# Patient Record
Sex: Male | Born: 1945 | Race: White | Hispanic: No | Marital: Married | Smoking: Former smoker
Health system: Southern US, Community
[De-identification: ages and names within clinical notes are randomized; demographics above are authoritative.]

## PROBLEM LIST (undated history)

## (undated) DIAGNOSIS — E785 Hyperlipidemia, unspecified: Secondary | ICD-10-CM

## (undated) DIAGNOSIS — I10 Essential (primary) hypertension: Secondary | ICD-10-CM

## (undated) DIAGNOSIS — N400 Enlarged prostate without lower urinary tract symptoms: Secondary | ICD-10-CM

## (undated) HISTORY — PX: APPENDECTOMY: SHX54

---

## 2015-06-18 ENCOUNTER — Encounter: Payer: Self-pay | Admitting: *Deleted

## 2015-06-21 ENCOUNTER — Ambulatory Visit: Payer: Medicare Other | Admitting: Anesthesiology

## 2015-06-21 ENCOUNTER — Encounter: Payer: Self-pay | Admitting: *Deleted

## 2015-06-21 ENCOUNTER — Ambulatory Visit
Admission: RE | Admit: 2015-06-21 | Discharge: 2015-06-21 | Disposition: A | Discharge status: Home / Self Care | Payer: Medicare Other | Source: Ambulatory Visit | Attending: Unknown Physician Specialty | Admitting: Unknown Physician Specialty

## 2015-06-21 ENCOUNTER — Encounter
Admission: RE | Disposition: A | Discharge status: Home / Self Care | Payer: Self-pay | Source: Ambulatory Visit | Attending: Unknown Physician Specialty

## 2015-06-21 DIAGNOSIS — I10 Essential (primary) hypertension: Secondary | ICD-10-CM | POA: Diagnosis not present

## 2015-06-21 DIAGNOSIS — E785 Hyperlipidemia, unspecified: Secondary | ICD-10-CM | POA: Diagnosis not present

## 2015-06-21 DIAGNOSIS — Z9049 Acquired absence of other specified parts of digestive tract: Secondary | ICD-10-CM | POA: Insufficient documentation

## 2015-06-21 DIAGNOSIS — Z79899 Other long term (current) drug therapy: Secondary | ICD-10-CM | POA: Diagnosis not present

## 2015-06-21 DIAGNOSIS — K5730 Diverticulosis of large intestine without perforation or abscess without bleeding: Secondary | ICD-10-CM | POA: Insufficient documentation

## 2015-06-21 DIAGNOSIS — K640 First degree hemorrhoids: Secondary | ICD-10-CM | POA: Diagnosis not present

## 2015-06-21 DIAGNOSIS — K621 Rectal polyp: Secondary | ICD-10-CM | POA: Insufficient documentation

## 2015-06-21 DIAGNOSIS — N400 Enlarged prostate without lower urinary tract symptoms: Secondary | ICD-10-CM | POA: Diagnosis not present

## 2015-06-21 DIAGNOSIS — Z87891 Personal history of nicotine dependence: Secondary | ICD-10-CM | POA: Insufficient documentation

## 2015-06-21 DIAGNOSIS — D123 Benign neoplasm of transverse colon: Secondary | ICD-10-CM | POA: Diagnosis not present

## 2015-06-21 DIAGNOSIS — Z1211 Encounter for screening for malignant neoplasm of colon: Secondary | ICD-10-CM | POA: Insufficient documentation

## 2015-06-21 HISTORY — DX: Essential (primary) hypertension: I10

## 2015-06-21 HISTORY — DX: Hyperlipidemia, unspecified: E78.5

## 2015-06-21 HISTORY — DX: Benign prostatic hyperplasia without lower urinary tract symptoms: N40.0

## 2015-06-21 HISTORY — PX: COLONOSCOPY WITH PROPOFOL: SHX5780

## 2015-06-21 SURGERY — COLONOSCOPY WITH PROPOFOL
Anesthesia: General

## 2015-06-21 MED ORDER — SODIUM CHLORIDE 0.9 % IV SOLN
INTRAVENOUS | Status: DC
  Administered 2015-06-21 (×2): via INTRAVENOUS

## 2015-06-21 MED ORDER — SODIUM CHLORIDE 0.9 % IV SOLN: INTRAVENOUS | Status: DC

## 2015-06-21 MED ORDER — LIDOCAINE HCL (CARDIAC) 20 MG/ML IV SOLN
INTRAVENOUS | Status: DC | PRN
  Administered 2015-06-21: 30 mg via INTRAVENOUS

## 2015-06-21 MED ORDER — PROPOFOL 10 MG/ML IV BOLUS
INTRAVENOUS | Status: DC | PRN
  Administered 2015-06-21: 100 mg via INTRAVENOUS

## 2015-06-21 MED ORDER — PROPOFOL 500 MG/50ML IV EMUL
INTRAVENOUS | Status: DC | PRN
  Administered 2015-06-21: 170 ug/kg/min via INTRAVENOUS

## 2015-06-21 MED ORDER — FENTANYL CITRATE (PF) 100 MCG/2ML IJ SOLN
INTRAMUSCULAR | Status: DC | PRN
  Administered 2015-06-21: 50 ug via INTRAVENOUS

## 2015-06-21 MED ORDER — MIDAZOLAM HCL 5 MG/5ML IJ SOLN
INTRAMUSCULAR | Status: DC | PRN
  Administered 2015-06-21: 1 mg via INTRAVENOUS

## 2015-06-21 NOTE — Transfer of Care (Signed)
Immediate Anesthesia Transfer of Care Note  Patient: Hayden Gray  Procedure(s) Performed: Procedure(s): COLONOSCOPY WITH PROPOFOL (N/A)  Patient Location: PACU and Short Stay  Anesthesia Type:General  Level of Consciousness: oriented and patient cooperative  Airway & Oxygen Therapy: Patient Spontanous Breathing and Patient connected to nasal cannula oxygen  Post-op Assessment: Report given to RN and Post -op Vital signs reviewed and stable  Post vital signs: Reviewed and stable  Last Vitals:  Filed Vitals:   06/21/15 1250  BP: 138/80  Pulse: 81  Temp: 35.8 C  Resp: 16    Complications: No apparent anesthesia complications

## 2015-06-21 NOTE — H&P (Signed)
   Primary Care Physician:  Rusty Aus., MD Primary Gastroenterologist:  Dr. Vira Agar  Pre-Procedure History & Physical: HPI:  Hayden Gray is a 69 y.o. male is here for an colonoscopy.   Past Medical History  Diagnosis Date  . Hypertension   . BPH (benign prostatic hypertrophy)   . Hyperlipidemia     Past Surgical History  Procedure Laterality Date  . Appendectomy      Prior to Admission medications   Medication Sig Start Date End Date Taking? Authorizing Provider  atorvastatin (LIPITOR) 20 MG tablet Take 20 mg by mouth daily.   Yes Historical Provider, MD  finasteride (PROSCAR) 5 MG tablet Take 5 mg by mouth daily.   Yes Historical Provider, MD  fosinopril (MONOPRIL) 20 MG tablet Take 20 mg by mouth daily.   Yes Historical Provider, MD  tamsulosin (FLOMAX) 0.4 MG CAPS capsule Take 0.4 mg by mouth daily after breakfast.   Yes Historical Provider, MD    Allergies as of 05/25/2015  . (Not on File)    History reviewed. No pertinent family history.  Social History   Social History  . Marital Status: Married    Spouse Name: N/A  . Number of Children: N/A  . Years of Education: N/A   Occupational History  . Not on file.   Social History Main Topics  . Smoking status: Former Smoker    Types: Cigarettes    Quit date: 06/20/1972  . Smokeless tobacco: Not on file  . Alcohol Use: 0.6 oz/week    1 Cans of beer per week  . Drug Use: No  . Sexual Activity: Not on file   Other Topics Concern  . Not on file   Social History Narrative    Review of Systems: See HPI, otherwise negative ROS  Physical Exam: BP 138/80 mmHg  Pulse 81  Temp(Src) 96.5 F (35.8 C) (Tympanic)  Resp 16  Ht 5\' 8"  (1.727 m)  Wt 73.936 kg (163 lb)  BMI 24.79 kg/m2  SpO2 98% General:   Alert,  pleasant and cooperative in NAD Head:  Normocephalic and atraumatic. Neck:  Supple; no masses or thyromegaly. Lungs:  Clear throughout to auscultation.    Heart:  Regular rate and  rhythm. Abdomen:  Soft, nontender and nondistended. Normal bowel sounds, without guarding, and without rebound.   Neurologic:  Alert and  oriented x4;  grossly normal neurologically.  Impression/Plan: Hayden Gray is here for an colonoscopy to be performed for screening colonoscopy  Risks, benefits, limitations, and alternatives regarding  colonoscopy have been reviewed with the patient.  Questions have been answered.  All parties agreeable.   Gaylyn Cheers, MD  06/21/2015, 1:19 PM

## 2015-06-21 NOTE — Op Note (Signed)
[facility] Gastroenterology Patient Name: Hayden Gray Procedure Date: 06/21/2015 1:05 PM MRN: [id number] Account #: [id number] Date of Birth: Jul 23, 1946 Admit Type: Outpatient Age: 69 Room: [facility] ENDO ROOM 1 Gender: Male Note Status: Finalized Procedure:         Colonoscopy Indications:       Screening for colorectal malignant neoplasm Providers:         Manya Silvas, MD Referring MD:      Rusty Aus, MD (Referring MD) Medicines:         Propofol per Anesthesia Complications:     No immediate complications. Procedure:         Pre-Anesthesia Assessment:                    - After reviewing the risks and benefits, the patient was                     deemed in satisfactory condition to undergo the procedure.                    After obtaining informed consent, the colonoscope was                     passed under direct vision. Throughout the procedure, the                     patient's blood pressure, pulse, and oxygen saturations                     were monitored continuously. The Colonoscope was                     introduced through the anus and advanced to the the cecum,                     identified by appendiceal orifice and ileocecal valve. The                     colonoscopy was performed without difficulty. The patient                     tolerated the procedure well. The quality of the bowel                     preparation was good. Findings:      A diminutive polyp was found in the rectum. The polyp was sessile. The       polyp was removed with a jumbo cold forceps. Resection and retrieval       were complete.      A diminutive polyp was found at the splenic flexure. The polyp was       sessile. The polyp was removed with a jumbo cold forceps. Resection and       retrieval were complete.      Multiple small and large-mouthed diverticula were found in the sigmoid       colon.      Internal hemorrhoids were found during endoscopy. The  hemorrhoids were       small and Grade I (internal hemorrhoids that do not prolapse).      Two very small angioectasias without bleeding were found in the cecum. Impression:        - One diminutive polyp in the rectum. Resected and  retrieved.                    - One diminutive polyp at the splenic flexure. Resected                     and retrieved.                    - Diverticulosis in the sigmoid colon.                    - Internal hemorrhoids. Recommendation:    - Await pathology results. Manya Silvas, MD 06/21/2015 1:46:25 PM This report has been signed electronically. Number of Addenda: 0 Note Initiated On: 06/21/2015 1:05 PM Scope Withdrawal Time: 0 hours 10 minutes 2 seconds  Total Procedure Duration: 0 hours 15 minutes 0 seconds       [facility]

## 2015-06-21 NOTE — Anesthesia Preprocedure Evaluation (Signed)
Anesthesia Evaluation  Patient identified by MRN, date of birth, ID band Patient awake    Reviewed: Allergy & Precautions, H&P , NPO status , Patient's Chart, lab work & pertinent test results, reviewed documented beta blocker date and time   History of Anesthesia Complications Negative for: history of anesthetic complications  Airway Mallampati: III  TM Distance: >3 FB Neck ROM: full    Dental no notable dental hx. (+) Teeth Intact Permanent bridge on the bottom:   Pulmonary neg pulmonary ROS, former smoker,    Pulmonary exam normal breath sounds clear to auscultation       Cardiovascular Exercise Tolerance: Good hypertension, On Medications (-) angina(-) CAD, (-) Past MI, (-) Cardiac Stents and (-) CABG Normal cardiovascular exam(-) dysrhythmias (-) Valvular Problems/Murmurs Rhythm:regular Rate:Normal     Neuro/Psych negative neurological ROS  negative psych ROS   GI/Hepatic negative GI ROS, Neg liver ROS,   Endo/Other  negative endocrine ROS  Renal/GU negative Renal ROS  negative genitourinary   Musculoskeletal   Abdominal   Peds  Hematology negative hematology ROS (+)   Anesthesia Other Findings Past Medical History:   Hypertension                                                 BPH (benign prostatic hypertrophy)                           Hyperlipidemia                                               Reproductive/Obstetrics negative OB ROS                             Anesthesia Physical Anesthesia Plan  ASA: II  Anesthesia Plan: General   Post-op Pain Management:    Induction:   Airway Management Planned:   Additional Equipment:   Intra-op Plan:   Post-operative Plan:   Informed Consent: I have reviewed the patients History and Physical, chart, labs and discussed the procedure including the risks, benefits and alternatives for the proposed anesthesia with the patient or  authorized representative who has indicated his/her understanding and acceptance.   Dental Advisory Given  Plan Discussed with: Anesthesiologist, CRNA and Surgeon  Anesthesia Plan Comments:         Anesthesia Quick Evaluation

## 2015-06-22 ENCOUNTER — Encounter: Payer: Self-pay | Admitting: Unknown Physician Specialty

## 2015-06-22 NOTE — Anesthesia Postprocedure Evaluation (Signed)
  Anesthesia Post-op Note  Patient: Hayden Gray  Procedure(s) Performed: Procedure(s): COLONOSCOPY WITH PROPOFOL (N/A)  Anesthesia type:General  Patient location: PACU  Post pain: Pain level controlled  Post assessment: Post-op Vital signs reviewed, Patient's Cardiovascular Status Stable, Respiratory Function Stable, Patent Airway and No signs of Nausea or vomiting  Post vital signs: Reviewed and stable  Last Vitals:  Filed Vitals:   06/21/15 1430  BP: 132/76  Pulse: 59  Temp:   Resp: 16    Level of consciousness: awake, alert  and patient cooperative  Complications: No apparent anesthesia complications

## 2015-06-23 LAB — SURGICAL PATHOLOGY

## 2018-01-09 ENCOUNTER — Other Ambulatory Visit: Payer: Self-pay | Admitting: Internal Medicine

## 2018-01-09 DIAGNOSIS — R911 Solitary pulmonary nodule: Secondary | ICD-10-CM

## 2018-01-16 ENCOUNTER — Ambulatory Visit: Payer: Medicare Other

## 2018-01-22 ENCOUNTER — Ambulatory Visit
Admission: RE | Admit: 2018-01-22 | Discharge: 2018-01-22 | Disposition: A | Discharge status: Home / Self Care | Payer: Medicare Other | Source: Ambulatory Visit | Attending: Internal Medicine | Admitting: Internal Medicine

## 2018-01-22 DIAGNOSIS — R918 Other nonspecific abnormal finding of lung field: Secondary | ICD-10-CM | POA: Insufficient documentation

## 2018-01-22 DIAGNOSIS — N200 Calculus of kidney: Secondary | ICD-10-CM | POA: Insufficient documentation

## 2018-01-22 DIAGNOSIS — R911 Solitary pulmonary nodule: Secondary | ICD-10-CM

## 2018-01-22 DIAGNOSIS — E042 Nontoxic multinodular goiter: Secondary | ICD-10-CM | POA: Diagnosis not present

## 2018-01-22 LAB — POCT I-STAT CREATININE: Creatinine, Ser: 1 mg/dL (ref 0.61–1.24)

## 2018-01-22 MED ORDER — IOPAMIDOL (ISOVUE-300) INJECTION 61%
75.0000 mL | Freq: Once | INTRAVENOUS | Status: AC | PRN
  Administered 2018-01-22: 75 mL via INTRAVENOUS

## 2018-01-29 ENCOUNTER — Other Ambulatory Visit: Payer: Self-pay | Admitting: Internal Medicine

## 2018-01-29 DIAGNOSIS — N2889 Other specified disorders of kidney and ureter: Secondary | ICD-10-CM

## 2018-02-21 ENCOUNTER — Emergency Department: Payer: Medicare Other

## 2018-02-21 ENCOUNTER — Other Ambulatory Visit: Payer: Self-pay

## 2018-02-21 ENCOUNTER — Ambulatory Visit
Admission: RE | Admit: 2018-02-21 | Discharge: 2018-02-21 | Disposition: A | Discharge status: Home / Self Care | Payer: Medicare Other | Source: Ambulatory Visit | Attending: Internal Medicine | Admitting: Internal Medicine

## 2018-02-21 ENCOUNTER — Inpatient Hospital Stay
Admission: EM | Admit: 2018-02-21 | Discharge: 2018-02-22 | DRG: 916 | Disposition: A | Discharge status: Home / Self Care | Payer: Medicare Other | Attending: Internal Medicine | Admitting: Internal Medicine

## 2018-02-21 DIAGNOSIS — I10 Essential (primary) hypertension: Secondary | ICD-10-CM | POA: Diagnosis not present

## 2018-02-21 DIAGNOSIS — T782XXA Anaphylactic shock, unspecified, initial encounter: Secondary | ICD-10-CM | POA: Diagnosis present

## 2018-02-21 DIAGNOSIS — T886XXA Anaphylactic reaction due to adverse effect of correct drug or medicament properly administered, initial encounter: Principal | ICD-10-CM | POA: Diagnosis present

## 2018-02-21 DIAGNOSIS — E785 Hyperlipidemia, unspecified: Secondary | ICD-10-CM | POA: Diagnosis not present

## 2018-02-21 DIAGNOSIS — Z87891 Personal history of nicotine dependence: Secondary | ICD-10-CM

## 2018-02-21 DIAGNOSIS — T508X5A Adverse effect of diagnostic agents, initial encounter: Secondary | ICD-10-CM | POA: Diagnosis present

## 2018-02-21 DIAGNOSIS — N400 Enlarged prostate without lower urinary tract symptoms: Secondary | ICD-10-CM | POA: Diagnosis not present

## 2018-02-21 DIAGNOSIS — N2889 Other specified disorders of kidney and ureter: Secondary | ICD-10-CM | POA: Diagnosis not present

## 2018-02-21 DIAGNOSIS — Y848 Other medical procedures as the cause of abnormal reaction of the patient, or of later complication, without mention of misadventure at the time of the procedure: Secondary | ICD-10-CM | POA: Diagnosis not present

## 2018-02-21 DIAGNOSIS — Z91041 Radiographic dye allergy status: Secondary | ICD-10-CM | POA: Diagnosis not present

## 2018-02-21 DIAGNOSIS — Z79899 Other long term (current) drug therapy: Secondary | ICD-10-CM | POA: Diagnosis not present

## 2018-02-21 LAB — COMPREHENSIVE METABOLIC PANEL
ALBUMIN: 4.3 g/dL (ref 3.5–5.0)
ALK PHOS: 84 U/L (ref 38–126)
ALT: 20 U/L (ref 0–44)
ANION GAP: 10 (ref 5–15)
AST: 26 U/L (ref 15–41)
BUN: 16 mg/dL (ref 8–23)
CO2: 22 mmol/L (ref 22–32)
Calcium: 8.8 mg/dL — ABNORMAL LOW (ref 8.9–10.3)
Chloride: 110 mmol/L (ref 98–111)
Creatinine, Ser: 0.92 mg/dL (ref 0.61–1.24)
GFR calc non Af Amer: >60 mL/min (ref >60)
GLUCOSE: 133 mg/dL — AB (ref 70–99)
POTASSIUM: 3.1 mmol/L — AB (ref 3.5–5.1)
SODIUM: 142 mmol/L (ref 135–145)
Total Bilirubin: 2.6 mg/dL — ABNORMAL HIGH (ref 0.3–1.2)
Total Protein: 7.2 g/dL (ref 6.5–8.1)

## 2018-02-21 LAB — CBC WITH DIFFERENTIAL/PLATELET
BASOS ABS: 0.1 10*3/uL (ref 0–0.1)
BASOS PCT: 1 %
EOS ABS: 0.1 10*3/uL (ref 0–0.7)
Eosinophils Relative: 1 %
HEMATOCRIT: 41.1 % (ref 40.0–52.0)
HEMOGLOBIN: 14.4 g/dL (ref 13.0–18.0)
Lymphocytes Relative: 44 %
Lymphs Abs: 4.4 10*3/uL — ABNORMAL HIGH (ref 1.0–3.6)
MCH: 32.6 pg (ref 26.0–34.0)
MCHC: 35.1 g/dL (ref 32.0–36.0)
MCV: 92.7 fL (ref 80.0–100.0)
Monocytes Absolute: 0.9 10*3/uL (ref 0.2–1.0)
Monocytes Relative: 9 %
NEUTROS ABS: 4.5 10*3/uL (ref 1.4–6.5)
NEUTROS PCT: 45 %
Platelets: 218 10*3/uL (ref 150–440)
RBC: 4.44 MIL/uL (ref 4.40–5.90)
RDW: 14.2 % (ref 11.5–14.5)
WBC: 10 10*3/uL (ref 3.8–10.6)

## 2018-02-21 LAB — MRSA PCR SCREENING: MRSA by PCR: NEGATIVE

## 2018-02-21 LAB — TROPONIN I

## 2018-02-21 LAB — GLUCOSE, CAPILLARY: Glucose-Capillary: 207 mg/dL — ABNORMAL HIGH (ref 70–99)

## 2018-02-21 MED ORDER — IBUPROFEN 400 MG PO TABS
400.0000 mg | ORAL_TABLET | Freq: Four times a day (QID) | ORAL | Status: DC | PRN
Start: 2018-02-21 — End: 2018-02-22

## 2018-02-21 MED ORDER — GADOBENATE DIMEGLUMINE 529 MG/ML IV SOLN
15.0000 mL | Freq: Once | INTRAVENOUS | Status: AC | PRN
  Administered 2018-02-21: 15 mL via INTRAVENOUS

## 2018-02-21 MED ORDER — TAMSULOSIN HCL 0.4 MG PO CAPS: 0.4000 mg | ORAL_CAPSULE | Freq: Every day | ORAL | Status: DC

## 2018-02-21 MED ORDER — METHYLPREDNISOLONE SODIUM SUCC 125 MG IJ SOLR
60.0000 mg | Freq: Four times a day (QID) | INTRAMUSCULAR | Status: DC
  Administered 2018-02-21 – 2018-02-22 (×3): 60 mg via INTRAVENOUS
  Filled 2018-02-21 (×3): qty 2

## 2018-02-21 MED ORDER — DIPHENHYDRAMINE HCL 50 MG/ML IJ SOLN: 25.0000 mg | Freq: Four times a day (QID) | INTRAMUSCULAR | Status: DC | PRN

## 2018-02-21 MED ORDER — EPINEPHRINE PF 1 MG/ML IJ SOLN
0.5000 ug/min | INTRAMUSCULAR | Status: DC
  Administered 2018-02-21: 0.5 ug/min via INTRAVENOUS
  Filled 2018-02-21: qty 4

## 2018-02-21 MED ORDER — ENOXAPARIN SODIUM 40 MG/0.4ML ~~LOC~~ SOLN
40.0000 mg | SUBCUTANEOUS | Status: DC
  Administered 2018-02-21: 40 mg via SUBCUTANEOUS
  Filled 2018-02-21: qty 0.4

## 2018-02-21 MED ORDER — ALBUTEROL SULFATE (2.5 MG/3ML) 0.083% IN NEBU: 2.5000 mg | INHALATION_SOLUTION | RESPIRATORY_TRACT | Status: DC | PRN

## 2018-02-21 MED ORDER — SODIUM CHLORIDE 0.9% FLUSH
3.0000 mL | Freq: Two times a day (BID) | INTRAVENOUS | Status: DC
  Administered 2018-02-21 – 2018-02-22 (×2): 3 mL via INTRAVENOUS

## 2018-02-21 MED ORDER — POLYETHYLENE GLYCOL 3350 17 G PO PACK: 17.0000 g | PACK | Freq: Every day | ORAL | Status: DC | PRN

## 2018-02-21 MED ORDER — EPINEPHRINE 0.3 MG/0.3ML IJ SOAJ
0.3000 mg | Freq: Once | INTRAMUSCULAR | Status: AC
Start: 2018-02-21 — End: 2018-02-21
  Administered 2018-02-21: 0.3 mg via INTRAMUSCULAR

## 2018-02-21 MED ORDER — LISINOPRIL 20 MG PO TABS
20.0000 mg | ORAL_TABLET | Freq: Every day | ORAL | Status: DC
Start: 2018-02-21 — End: 2018-02-22
  Administered 2018-02-21: 20 mg via ORAL
  Filled 2018-02-21 (×2): qty 1

## 2018-02-21 MED ORDER — ONDANSETRON HCL 4 MG/2ML IJ SOLN: 4.0000 mg | Freq: Four times a day (QID) | INTRAMUSCULAR | Status: DC | PRN

## 2018-02-21 MED ORDER — EPINEPHRINE 0.3 MG/0.3ML IJ SOAJ
0.3000 mg | Freq: Once | INTRAMUSCULAR | Status: AC
  Administered 2018-02-21: 0.3 mg via INTRAMUSCULAR

## 2018-02-21 MED ORDER — METHYLPREDNISOLONE SODIUM SUCC 125 MG IJ SOLR
125.0000 mg | Freq: Once | INTRAMUSCULAR | Status: AC
  Administered 2018-02-21: 125 mg via INTRAVENOUS
  Filled 2018-02-21: qty 2

## 2018-02-21 MED ORDER — ACETAMINOPHEN 325 MG PO TABS
650.0000 mg | ORAL_TABLET | Freq: Four times a day (QID) | ORAL | Status: DC | PRN
Start: 2018-02-21 — End: 2018-02-22
  Administered 2018-02-22: 650 mg via ORAL
  Filled 2018-02-21: qty 2

## 2018-02-21 MED ORDER — POTASSIUM CHLORIDE CRYS ER 20 MEQ PO TBCR
30.0000 meq | EXTENDED_RELEASE_TABLET | ORAL | Status: AC
  Administered 2018-02-21 – 2018-02-22 (×2): 30 meq via ORAL
  Filled 2018-02-21 (×2): qty 2

## 2018-02-21 MED ORDER — FINASTERIDE 5 MG PO TABS
5.0000 mg | ORAL_TABLET | Freq: Every day | ORAL | Status: DC
  Administered 2018-02-21: 5 mg via ORAL
  Filled 2018-02-21 (×2): qty 1

## 2018-02-21 MED ORDER — ONDANSETRON HCL 4 MG PO TABS: 4.0000 mg | ORAL_TABLET | Freq: Four times a day (QID) | ORAL | Status: DC | PRN

## 2018-02-21 MED ORDER — FAMOTIDINE IN NACL 20-0.9 MG/50ML-% IV SOLN
20.0000 mg | Freq: Once | INTRAVENOUS | Status: AC
  Administered 2018-02-21: 20 mg via INTRAVENOUS

## 2018-02-21 MED ORDER — ACETAMINOPHEN 650 MG RE SUPP: 650.0000 mg | Freq: Four times a day (QID) | RECTAL | Status: DC | PRN

## 2018-02-21 MED ORDER — SODIUM CHLORIDE 0.9 % IV BOLUS
1000.0000 mL | Freq: Once | INTRAVENOUS | Status: AC
Start: 2018-02-21 — End: 2018-02-21
  Administered 2018-02-21: 1000 mL via INTRAVENOUS

## 2018-02-21 MED ORDER — TAMSULOSIN HCL 0.4 MG PO CAPS
0.4000 mg | ORAL_CAPSULE | Freq: Every day | ORAL | Status: DC
  Administered 2018-02-21: 0.4 mg via ORAL
  Filled 2018-02-21: qty 1

## 2018-02-21 NOTE — ED Notes (Signed)
Called back and spoke with Evlyn Clines RN in ICU. She stated they were not able to take patient at this time and would call back shortly.

## 2018-02-21 NOTE — ED Notes (Signed)
Attempted to call report. Per Kendrick Fries they are not ready and will call when able to take patient. Will Medical illustrator

## 2018-02-21 NOTE — ED Notes (Signed)
Pt given 1st epi pen injection by MD Rifenbark

## 2018-02-21 NOTE — ED Notes (Signed)
Attempted to call report. Per Kendrick Fries they are not ready and unable to take patient at this time

## 2018-02-21 NOTE — Consult Note (Signed)
^^^For educational purposes only^^^^  Name: Hayden Gray MRN: [id number] DOB: 27-Dec-1945    ADMISSION DATE:  02/21/2018 CONSULTATION DATE:  02/21/2018  REFERRING MD :    CHIEF COMPLAINT:  anaphylactic reaction to gadolinium   BRIEF PATIENT DESCRIPTION: 72 y/o caucasian male admitted for anaphylactic reaction to MRI contrast gadolinium   SIGNIFICANT EVENTS/STUDIES:  7/11-MRI of abdomen with and without contrast performed and patient experienced angioedema, hives and hoarseness    HISTORY OF PRESENT ILLNESS:  Mr. Hollyfield is a 72 year old, Caucasian male with a PMH significant for HTN, BPH and dyslipidemia whom was undergoing a MRI of the abdomen (with and without contrast) for evaluation of a renal mass when he suddenly developed lip numbness, hoarseness of the voice and diaphoresis likely secondary to an anaphylactic reaction to the MRI contrast gadolinium. While in MRI the patient was given 50mg  of IV Benadryl without any resolve of symptoms and was sent to the ED for further treatment. While in the ED the patient continued to experience angioedema and began to develop hives on his neck, torso and lower legs. While in the ED the patient received 2 Epi injections, IV solu-medrol and started on an Epinephrine drip. The patient was then transferred to the Step -down/ICU unit for further management by the hospitalist team. The patient denies any shortness of breath, numbness of the face/lip, dysphagia, chest pain or dizziness.    PAST MEDICAL HISTORY :   has a past medical history of BPH (benign prostatic hypertrophy), Hyperlipidemia, and Hypertension.  has a past surgical history that includes Appendectomy and Colonoscopy with propofol (N/A, 06/21/2015). Prior to Admission medications   Medication Sig Start Date End Date Taking? Authorizing Provider  atorvastatin (LIPITOR) 20 MG tablet Take 20 mg by mouth daily.   Yes [provider]  finasteride (PROSCAR) 5 MG tablet Take 5 mg by  mouth daily.   Yes [provider]  fosinopril (MONOPRIL) 20 MG tablet Take 20 mg by mouth daily.   Yes [provider]  tamsulosin (FLOMAX) 0.4 MG CAPS capsule Take 0.4 mg by mouth daily after breakfast.   Yes [provider]   Allergies  Allergen Reactions  . Gadolinium Derivatives     After receiving Multihance MRI contrast pt began to sneeze, cough, lips swelling, and hives. Pt evaluated by radiologist Dr. Jearld Lesch who sent pt to ED   . Contrast Media [Iodinated Diagnostic Agents]     FAMILY HISTORY:  family history is not on file. SOCIAL HISTORY:  reports that he quit smoking about 45 years ago. His smoking use included cigarettes. He does not have any smokeless tobacco history on file. He reports that he drinks about 0.6 oz of alcohol per week. He reports that he does not use drugs.  REVIEW OF SYSTEMS:   Constitutional: Negative for fever, chills, weight loss, malaise/fatigue and diaphoresis.  HENT: Positive for dry throat, Negative for dysphagia, angioedema, sore throat, hearing loss, ear pain, nosebleeds, congestion, neck pain, tinnitus and ear discharge.   Eyes: Negative for blurred vision, double vision, photophobia, pain, discharge and redness.  Respiratory: Negative for cough, hemoptysis, sputum production, shortness of breath, wheezing and stridor.   Cardiovascular: Negative for chest pain, palpitations, orthopnea, claudication, leg swelling and PND.  Gastrointestinal: Negative for heartburn, nausea, vomiting, abdominal pain, diarrhea, constipation, blood in stool and melena.  Genitourinary: Negative for dysuria, urgency, frequency, hematuria and flank pain.  Musculoskeletal: Negative for myalgias, back pain, joint pain and falls.  Skin: Positive for urticaria to neck,  torso and bilateral lower extremities.  Neurological: Negative for dizziness, tingling, tremors, sensory change, speech change, focal weakness, seizures, loss of consciousness, weakness  and headaches.  Endo/Heme/Allergies: Negative for environmental allergies and polydipsia. Does not bruise/bleed easily.  SUBJECTIVE:   VITAL SIGNS: Temp:  [98 F (36.7 C)-98.5 F (36.9 C)] 98 F (36.7 C) (07/11 1900) Pulse Rate:  [78-101] 101 (07/11 1900) Resp:  [19-27] 27 (07/11 1900) BP: (152-179)/(74-92) 160/92 (07/11 1900) SpO2:  [95 %-96 %] 96 % (07/11 1900) Weight:  [73.5 kg (162 lb)-75.7 kg (166 lb 14.2 oz)] 75.7 kg (166 lb 14.2 oz) (07/11 1900)  PHYSICAL EXAMINATION: General: appears well-kept, resting comfortably in bed, pleasant, appears well-nourished  Neuro:  AOx4, cranial nerves II-XII grossly intact, full sensation HEENT:  Head: normocephalic, atraumatic, negative for nodules  Eyes: Equal, round, reactive to light and accommodation, sclera pink, conjunctiva pink Ears: intact, symmetrical, no gross hearing loss Nose: intact, negative for nasal drainage or hemorrhage Throat: intact, trachea midline, voice hoarse  Cardiovascular: s1, s2 noted, tachycardiac, +3 bound peripheral pulses, capillary refill <3 seconds, negative for JVD, s3, s4, murmur or edema.  Lungs: chest expansion symmetrical, bilateral lungs sounds clear upon auscultation breathing regular, easy, unlabored, negative for crepitus or respiratory distress   Abdomen: soft, rounded, non-distended, non-tender, bowel sounds active x4 quadrants Musculoskeletal:  Active ROM in all extremities, 5/5 strength in all extremities, negative for stiffness Skin: pink, dry, intact, urticaria to neck, torso and bilateral lower extremities   Recent Labs  Lab 02/21/18 1718  NA 142  K 3.1*  CL 110  CO2 22  BUN 16  CREATININE 0.92  GLUCOSE 133*   Recent Labs  Lab 02/21/18 1718  HGB 14.4  HCT 41.1  WBC 10.0  PLT 218   Dg Chest Port 1 View  Result Date: 02/21/2018 CLINICAL DATA:  Shortness of breath. EXAM: PORTABLE CHEST 1 VIEW COMPARISON:  Chest CT 01/22/2018 FINDINGS: 1722 hours. The lungs are clear without  focal pneumonia, edema, pneumothorax or pleural effusion. Nodular density over the right posterior sixth rib noted on previous CT to be within the rib itself. The cardiopericardial silhouette is within normal limits for size. The visualized bony structures of the thorax are intact. Telemetry leads overlie the chest. IMPRESSION: 1. No acute cardiopulmonary findings. Electronically Signed   By: Misty Stanley M.D.   On: 02/21/2018 18:17    ASSESSMENT / PLAN: Anaphylactic reaction likely secondary to MRI contrast gadolinium Plan: Continue Epinephrine drip Monitor Vitals q4 hours Monitor UOP Prn bronchodilator therapy for respiratory distress Prn benadryl Prn supplemental Oxygen IV Solu-medrol   Renal Mass Plan: Follow up as outpatient with [facility]  Hypertension Plan: Continue at home Ace Inhibitor therapy  DVT Prophylaxis with Lovenox subcutaneously    Pulmonary and [place] Pager: [phone]  02/21/2018, 9:11 PM

## 2018-02-21 NOTE — Consult Note (Signed)
Name: Hayden Gray MRN: [id number] DOB: May 01, 1946    ADMISSION DATE:  02/21/2018 CONSULTATION DATE: 02/21/2018  REFERRING MD : Dr. Darvin Neighbours   CHIEF COMPLAINT: Allergic Reaction  BRIEF PATIENT DESCRIPTION:  72 yo male admitted with anaphylaxis secondary to contrast dye requiring epinephrine gtt  SIGNIFICANT EVENTS/STUDIES:  07/11 Pt admitted to stepdown unit   HISTORY OF PRESENT ILLNESS:   This is a 72 yo male with a  PMH as listed below who presented to [facility] on 07/11 for an outpatient MRI to assess a renal mass.  He received IV gadolinium contrast for the first time during the MRI and developed severe shortness of breath, urticarial rash, and lip swelling with numbness.  He received 50 mg iv benadryl in MRI without improvement of symptoms, therefore he was transported to the ER.  In the ER he received 0.3 mg of intramuscular epinephrine, pepcid, and solumedrol.  However, his symptoms did not improve he received a second dose of intramuscular epinephrine and epinephrine drip started symptoms mildly improved. He was subsequently admitted to the stepdown unit by hospitalist team for further workup and treatment.   PAST MEDICAL HISTORY :   has a past medical history of BPH (benign prostatic hypertrophy), Hyperlipidemia, and Hypertension.  has a past surgical history that includes Appendectomy and Colonoscopy with propofol (N/A, 06/21/2015). Prior to Admission medications   Medication Sig Start Date End Date Taking? Authorizing Provider  atorvastatin (LIPITOR) 20 MG tablet Take 20 mg by mouth daily.   Yes [provider]  finasteride (PROSCAR) 5 MG tablet Take 5 mg by mouth daily.   Yes [provider]  fosinopril (MONOPRIL) 20 MG tablet Take 20 mg by mouth daily.   Yes [provider]  tamsulosin (FLOMAX) 0.4 MG CAPS capsule Take 0.4 mg by mouth daily after breakfast.   Yes [provider]   Allergies  Allergen Reactions  . Gadolinium Derivatives    After receiving Multihance MRI contrast pt began to sneeze, cough, lips swelling, and hives. Pt evaluated by radiologist Dr. Jearld Lesch who sent pt to ED   . Contrast Media [Iodinated Diagnostic Agents]     FAMILY HISTORY:  family history is not on file. SOCIAL HISTORY:  reports that he quit smoking about 45 years ago. His smoking use included cigarettes. He does not have any smokeless tobacco history on file. He reports that he drinks about 0.6 oz of alcohol per week. He reports that he does not use drugs.  REVIEW OF SYSTEMS: Positives in BOLD Constitutional: Negative for fever, chills, weight loss, malaise/fatigue and diaphoresis.  HENT: hearing loss, ear pain, nosebleeds, congestion, lip and tongue swelling with numbness, sore throat, neck pain, tinnitus and ear discharge.   Eyes: Negative for blurred vision, double vision, photophobia, pain, discharge and redness.  Respiratory: cough, hemoptysis, sputum production, shortness of breath, wheezing and stridor.   Cardiovascular: Negative for chest pain, palpitations, orthopnea, claudication, leg swelling and PND.  Gastrointestinal: Negative for heartburn, nausea, vomiting, abdominal pain, diarrhea, constipation, blood in stool and melena.  Genitourinary: Negative for dysuria, urgency, frequency, hematuria and flank pain.  Musculoskeletal: Negative for myalgias, back pain, joint pain and falls.  Skin: itching and rash.  Neurological: Negative for dizziness, tingling, tremors, sensory change, speech change, focal weakness, seizures, loss of consciousness, weakness and headaches.  Endo/Heme/Allergies: Negative for environmental allergies and polydipsia. Does not bruise/bleed easily.  SUBJECTIVE:  Pt states symptoms improved   VITAL SIGNS: Temp:  [98 F (36.7 C)-98.5 F (36.9 C)] 98  F (36.7 C) (07/11 1900) Pulse Rate:  [78-101] 101 (07/11 1900) Resp:  [19-27] 27 (07/11 1900) BP: (152-179)/(74-92) 160/92 (07/11 1900) SpO2:  [95 %-96 %] 96 %  (07/11 1900) Weight:  [73.5 kg (162 lb)-75.7 kg (166 lb 14.2 oz)] 75.7 kg (166 lb 14.2 oz) (07/11 1900)  PHYSICAL EXAMINATION: General: well developed, well nourished male, NAD  Neuro: alert and oriented, follows commands  HEENT: supple, no JVD, tongue and lip swelling resolved  Cardiovascular: nsr, rrr, no R/G Lungs: clear throughout, even, non labored  Abdomen: +BS x4, soft, non tender, non distended  Musculoskeletal: normal bulk and tone, no edema  Skin: intact no rashes or lesions   Recent Labs  Lab 02/21/18 1718  NA 142  K 3.1*  CL 110  CO2 22  BUN 16  CREATININE 0.92  GLUCOSE 133*   Recent Labs  Lab 02/21/18 1718  HGB 14.4  HCT 41.1  WBC 10.0  PLT 218   Dg Chest Port 1 View  Result Date: 02/21/2018 CLINICAL DATA:  Shortness of breath. EXAM: PORTABLE CHEST 1 VIEW COMPARISON:  Chest CT 01/22/2018 FINDINGS: 1722 hours. The lungs are clear without focal pneumonia, edema, pneumothorax or pleural effusion. Nodular density over the right posterior sixth rib noted on previous CT to be within the rib itself. The cardiopericardial silhouette is within normal limits for size. The visualized bony structures of the thorax are intact. Telemetry leads overlie the chest. IMPRESSION: 1. No acute cardiopulmonary findings. Electronically Signed   By: Misty Stanley M.D.   On: 02/21/2018 18:17    ASSESSMENT / PLAN: Anaphylactic reaction due to iv gadolinium contrast  Renal Mass-follow-up in outpatient setting   Hx: HTN, Hyperlipidemia, and BPH P: Supplemental O2 for dyspnea and/or hypoxia  IV steroids  Prn bronchodilator therapy  Prn iv benadryl  Continue epinephrine gtt for now  Continue outpatient antihypertensives Continuous telemetry monitoring  VTE px: subq lovenox Trend CBC Monitor for s/sx of bleeding and transfuse for hgb <7 Trend BMP  Replace electrolytes as indicated  Monitor UOP   Marda Stalker, [place] Pager [phone] (please enter 7  digits) PCCM Consult Pager [phone] (please enter 7 digits)

## 2018-02-21 NOTE — ED Provider Notes (Signed)
[facility] Emergency Department Provider Note  ____________________________________________   First MD Initiated Contact with Patient 02/21/18 1705     (approximate)  I have reviewed the triage vital signs and the nursing notes.   HISTORY  Chief Complaint Allergic Reaction   HPI Hayden Gray is a 72 y.o. male who comes to the emergency department from outpatient MRI for an anaphylactic reaction.  He had just received IV gadolinium for the first time in his life and he had sudden onset severe shortness of breath urticarial rash and lip swelling.  He was given 50 mg of IV Benadryl without improvement so he came to the emergency department.  He does have a severe allergic reaction to IV CT contrast but is never had an MRI before.  He feels like his voice is not normal.  He has no difficulty swallowing and no drooling.  He does feel short of breath.  He has a history of BPH hypertension and dyslipidemia.  His symptoms came on suddenly and are quite severe.  Nothing has seemed to make them better and they are clearly worse with contrast.    Past Medical History:  Diagnosis Date  . BPH (benign prostatic hypertrophy)   . Hyperlipidemia   . Hypertension     Patient Active Problem List   Diagnosis Date Noted  . Anaphylactic reaction 02/21/2018    Past Surgical History:  Procedure Laterality Date  . APPENDECTOMY    . COLONOSCOPY WITH PROPOFOL N/A 06/21/2015   Procedure: COLONOSCOPY WITH PROPOFOL;  Surgeon: Manya Silvas, MD;  Location: [facility] ENDOSCOPY;  Service: Endoscopy;  Laterality: N/A;    Prior to Admission medications   Medication Sig Start Date End Date Taking? Authorizing Provider  atorvastatin (LIPITOR) 20 MG tablet Take 20 mg by mouth daily.   Yes [provider]  finasteride (PROSCAR) 5 MG tablet Take 5 mg by mouth daily.   Yes [provider]  fosinopril (MONOPRIL) 20 MG tablet Take 20 mg by mouth daily.   Yes [provider]  tamsulosin (FLOMAX) 0.4 MG CAPS capsule Take 0.4 mg by mouth daily after breakfast.   Yes [provider]    Allergies Gadolinium derivatives and Contrast media [iodinated diagnostic agents]  No family history on file.  Social History Social History   Tobacco Use  . Smoking status: Former Smoker    Types: Cigarettes    Last attempt to quit: 06/20/1972    Years since quitting: 45.7  Substance Use Topics  . Alcohol use: Yes    Alcohol/week: 0.6 oz    Types: 1 Cans of beer per week  . Drug use: No    Review of Systems Constitutional: No fever/chills Eyes: No visual changes. ENT: Positive for voice change Cardiovascular: Positive for chest pain. Respiratory: Positive for shortness of breath. Gastrointestinal: No abdominal pain.  Positive for nausea, no vomiting.  No diarrhea.  No constipation. Genitourinary: Negative for dysuria. Musculoskeletal: Negative for back pain. Skin: Positive for rash. Neurological: Negative for headaches, focal weakness or numbness.   ____________________________________________   PHYSICAL EXAM:  VITAL SIGNS: ED Triage Vitals  Enc Vitals Group     BP --      Pulse Rate 02/21/18 1704 82     Resp 02/21/18 1704 20     Temp 02/21/18 1704 98.5 F (36.9 C)     Temp Source 02/21/18 1704 Oral     SpO2 02/21/18 1704 96 %     Weight 02/21/18 1705 162  lb (73.5 kg)     Height 02/21/18 1705 _0  (1.727 m)     Head Circumference --      Peak Flow --      Pain Score 02/21/18 1705 0     Pain Loc --      Pain Edu? --      Excl. in [place]? --     Constitutional: Anxious appearing obviously short of breath very swollen lips appears critically ill Eyes: PERRL EOMI. Head: Atraumatic. Nose: No congestion/rhinnorhea. Mouth/Throat: Upper and lower lips extremely swollen Neck: No stridor.   Cardiovascular: Normal rate, regular rhythm. Grossly normal heart sounds.  Good peripheral circulation. Respiratory: Increased respiratory  effort with wheezing throughout although moving good air Gastrointestinal: Soft nontender Musculoskeletal: No lower extremity edema   Neurologic:  Normal speech and language. No gross focal neurologic deficits are appreciated. Skin: Diffuse deep erythema and urticaria across his face ears chest neck back thighs arms Psychiatric: Anxious appearing    ____________________________________________   DIFFERENTIAL includes but not limited to  Anaphylaxis, allergic reaction, urticaria, angioedema ____________________________________________   LABS (all labs ordered are listed, but only abnormal results are displayed)  Labs Reviewed  CBC WITH DIFFERENTIAL/PLATELET - Abnormal; Notable for the following components:      Result Value   Lymphs Abs 4.4 (*)    All other components within normal limits  COMPREHENSIVE METABOLIC PANEL  TROPONIN I  BASIC METABOLIC PANEL  CBC    Lab work reviewed by me with no acute disease __________________________________________  EKG  ED ECG REPORT I, Darel Hong, the attending physician, personally viewed and interpreted this ECG.  Date: 02/21/2018 EKG Time:  Rate: 76 Rhythm: normal sinus rhythm QRS Axis: normal Intervals: Slightly prolonged QTC ST/T Wave abnormalities: normal Narrative Interpretation: no evidence of acute ischemia  ____________________________________________  RADIOLOGY  Chest x-ray reviewed by me with no acute disease ____________________________________________   PROCEDURES  Procedure(s) performed: no  .Critical Care Performed by: Darel Hong, MD Authorized by: Darel Hong, MD   Critical care provider statement:    Critical care time (minutes):  30   Critical care time was exclusive of:  Separately billable procedures and treating other patients   Critical care was necessary to treat or prevent imminent or life-threatening deterioration of the following conditions: anaphylaxis.   Critical care was  time spent personally by me on the following activities:  Development of treatment plan with patient or surrogate, discussions with consultants, evaluation of patient's response to treatment, examination of patient, obtaining history from patient or surrogate, ordering and performing treatments and interventions, ordering and review of laboratory studies, ordering and review of radiographic studies, pulse oximetry, re-evaluation of patient's condition and review of old charts    Critical Care performed: yes  ____________________________________________   INITIAL IMPRESSION / [place] / ED COURSE  Pertinent labs & imaging results that were available during my care of the patient were reviewed by me and considered in my medical decision making (see chart for details).   The patient arrives in the emergency department from outpatient MRI with an anaphylactic reaction to gadolinium contrast.  This is the first time in his life that he is ever had gadolinium and he had a full dose today.  He had near immediate diffuse urticarial rash across his chest and face with swelling in his lips hoarse voice and sore throat.  He says he is not talking normally.  I immediately gave the patient 0.3 mg of intramuscular epinephrine in his  right thigh.  He had already received 50 mg of IV diphenhydramine downstairs so we will give him Pepcid fluids and Solu-Medrol here.  His symptoms did not improve in about 5 minutes so he was given a second dose of intramuscular epinephrine and have ordered an epinephrine drip.  I am particularly concerned about this anaphylaxis given his wheezing lungs, relative hypoxia, and a large load of gadolinium which I expect to continue to cause anaphylaxis for some time.  Given this constellation of symptoms the patient requires inpatient admission to a monitored setting for IV epinephrine and continued airway monitoring.  ----------------------------------------- 5:33 PM on  02/21/2018 -----------------------------------------  The patient's lip swelling is mildly improved and he feels somewhat better although is still saturating 96 or 97%.  I have a RIK kit and airway equipment at bedside but defer and intubation at this point.      ____________________________________________   FINAL CLINICAL IMPRESSION(S) / ED DIAGNOSES  Final diagnoses:  Anaphylaxis, initial encounter      NEW MEDICATIONS STARTED DURING THIS VISIT:  New Prescriptions   No medications on file     Note:  This document was prepared using Dragon voice recognition software and may include unintentional dictation errors.     Darel Hong, MD 02/21/18 1806

## 2018-02-21 NOTE — Consult Note (Signed)
Patient presented to radiology department for an MRI of the Abdomen without and with IV contrast. Following the examination the patient felt numbness in the lips with hoarseness and diaphoresis. Patient denied SOB. Patient was A&O x 3. Normal pulse at 90 bpm.   Given the lack of improvement in hoarseness and lip numbness this was felt to reflect a contrast reaction to Multihance. 50 mg IV Benadryl was administered. The patient was transported to the emergency department for observation in stable condition in a wheel chair by the performing technologist, Rolla Plate.  Kathreen Devoid, MD

## 2018-02-21 NOTE — ED Notes (Signed)
Attempted to call report. Per Kendrick Fries they are unable to take patient at this time. They will call back shortly when able to.

## 2018-02-21 NOTE — ED Triage Notes (Signed)
Pt comes via MRI tech from MRI with c/o allergic reaction following IV contrast given. Pt given IV contrast about 8 minutes ago and began sneezing, coughing, lip swelling and hives present over torso, arms, and legs. Pt was given 18 of ben by MD Posey Pronto.  Pt is alert and able to speak with MD Rifenbark at this time.

## 2018-02-21 NOTE — ED Notes (Signed)
Pt given 2nd epi pen by MD Rifenbark

## 2018-02-21 NOTE — H&P (Signed)
[place] at [place] NAME: Hayden Gray    MR#:  [id number]  DATE OF BIRTH:  21-Aug-1945  DATE OF ADMISSION:  02/21/2018  PRIMARY CARE PHYSICIAN: Rusty Aus, MD   REQUESTING/REFERRING PHYSICIAN: Dr. Mable Paris  CHIEF COMPLAINT:   Chief Complaint  Patient presents with  . Allergic Reaction   HISTORY OF PRESENT ILLNESS:  Hayden Gray  is a 72 y.o. male with a known history of hypertension, recently diagnosed renal mass on CT scan of the abdomen was in the radiology department to get an MRI of the abdomen with and without contrast.  Patient received IV gadolinium dose and then started sneezing, coughing and chills.  Later developed swelling of his lips and was brought to the emergency room. Patient received IV Benadryl, IV Solu-Medrol.  2 dose of epinephrine shots.  Patient continued to have significant wheezing, swelling of his lips and rash.  Started on epinephrine drip.  Feels some improvement  PAST MEDICAL HISTORY:   Past Medical History:  Diagnosis Date  . BPH (benign prostatic hypertrophy)   . Hyperlipidemia   . Hypertension     PAST SURGICAL HISTORY:   Past Surgical History:  Procedure Laterality Date  . APPENDECTOMY    . COLONOSCOPY WITH PROPOFOL N/A 06/21/2015   Procedure: COLONOSCOPY WITH PROPOFOL;  Surgeon: Manya Silvas, MD;  Location: [facility] ENDOSCOPY;  Service: Endoscopy;  Laterality: N/A;    SOCIAL HISTORY:   Social History   Tobacco Use  . Smoking status: Former Smoker    Types: Cigarettes    Last attempt to quit: 06/20/1972    Years since quitting: 45.7  Substance Use Topics  . Alcohol use: Yes    Alcohol/week: 0.6 oz    Types: 1 Cans of beer per week    FAMILY HISTORY:  No family history on file.  DRUG ALLERGIES:   Allergies  Allergen Reactions  . Gadolinium Derivatives     After receiving Multihance MRI contrast pt began to sneeze, cough, lips swelling, and hives. Pt evaluated by radiologist Dr.  Jearld Lesch who sent pt to ED   . Contrast Media [Iodinated Diagnostic Agents]     REVIEW OF SYSTEMS:   Review of Systems  Constitutional: Positive for malaise/fatigue. Negative for chills and fever.  HENT: Negative for sore throat.   Eyes: Negative for blurred vision, double vision and pain.  Respiratory: Positive for cough, shortness of breath and wheezing. Negative for hemoptysis.   Cardiovascular: Positive for palpitations. Negative for chest pain, orthopnea and leg swelling.  Gastrointestinal: Negative for abdominal pain, constipation, diarrhea, heartburn, nausea and vomiting.  Genitourinary: Negative for dysuria and hematuria.  Musculoskeletal: Negative for back pain and joint pain.  Skin: Negative for rash.  Neurological: Positive for dizziness. Negative for sensory change, speech change, focal weakness and headaches.  Endo/Heme/Allergies: Does not bruise/bleed easily.  Psychiatric/Behavioral: Negative for depression. The patient is not nervous/anxious.     MEDICATIONS AT HOME:   Prior to Admission medications   Medication Sig Start Date End Date Taking? Authorizing Provider  atorvastatin (LIPITOR) 20 MG tablet Take 20 mg by mouth daily.   Yes [provider]  finasteride (PROSCAR) 5 MG tablet Take 5 mg by mouth daily.   Yes [provider]  fosinopril (MONOPRIL) 20 MG tablet Take 20 mg by mouth daily.   Yes [provider]  tamsulosin (FLOMAX) 0.4 MG CAPS capsule Take 0.4 mg by mouth daily after breakfast.   Yes [provider]     VITAL SIGNS:  Blood pressure (!) 179/89, pulse 92, temperature 98.5 F (36.9 C), temperature source Oral, resp. rate 20, height 5\' 8"  (1.727 m), weight 73.5 kg (162 lb), SpO2 95 %.  PHYSICAL EXAMINATION:  Physical Exam  GENERAL:  72 y.o.-year-old patient lying in the bed with no acute distress.  EYES: Pupils equal, round, reactive to light and accommodation. No scleral icterus. Extraocular muscles intact.   HEENT: Head atraumatic, normocephalic.  Swelling of lips NECK:  Supple, no jugular venous distention. No thyroid enlargement, no tenderness.  LUNGS: Bilateral wheezing with decreased air entry CARDIOVASCULAR: S1, S2 normal. No murmurs, rubs, or gallops.  ABDOMEN: Soft, nontender, nondistended. Bowel sounds present. No organomegaly or mass.  EXTREMITIES: No pedal edema, cyanosis, or clubbing. + 2 pedal & radial pulses b/l.   NEUROLOGIC: Cranial nerves II through XII are intact. No focal Motor or sensory deficits appreciated b/l PSYCHIATRIC: The patient is alert and oriented x 3.  Anxious SKIN: Maculopapular rash on arms and torso  LABORATORY PANEL:   CBC Recent Labs  Lab 02/21/18 1718  WBC 10.0  HGB 14.4  HCT 41.1  PLT 218   ------------------------------------------------------------------------------------------------------------------  Chemistries  No results for input(s): NA, K, CL, CO2, GLUCOSE, BUN, CREATININE, CALCIUM, MG, AST, ALT, ALKPHOS, BILITOT in the last 168 hours.  Invalid input(s): GFRCGP ------------------------------------------------------------------------------------------------------------------  Cardiac Enzymes No results for input(s): TROPONINI in the last 168 hours. ------------------------------------------------------------------------------------------------------------------  RADIOLOGY:  No results found.   IMPRESSION AND PLAN:   * Anaphylactic reaction due to MRI contrast Patient given IV Solu-Medrol, Benadryl, 2 shots of epinephrine with improvement.  Continues to have wheezing and severe reaction.  Epinephrine drip. Critically ill.  Will be admitted to [facility] intensivist  *Renal mass.  Patient had MRI today. Will need outpatient follow-up.  *Hypertension.  Blood pressure significantly elevated due to epinephrine.  We will continue patient's ACE inhibitor.  *DVT prophylaxis with Lovenox   All the records are reviewed  and case discussed with ED provider. Management plans discussed with the patient, family and they are in agreement.  CODE STATUS: FULL CODE  TOTAL TIME TAKING CARE OF THIS PATIENT: 40 minutes.   Neita Carp M.D on 02/21/2018 at 5:46 PM  Between 7am to 6pm - Pager - [phone]  After 6pm go to [email or web address] - password [email or web address] [place] [facility]  Office  [phone]  CC: Primary care physician; Rusty Aus, MD  Note: This dictation was prepared with Dragon dictation along with smaller phrase technology. Any transcriptional errors that result from this process are unintentional.

## 2018-02-21 NOTE — ED Notes (Signed)
Attempted to call report. Per Kendrick Fries they are unable to take patient at this time and will call me when ready.

## 2018-02-22 DIAGNOSIS — T782XXA Anaphylactic shock, unspecified, initial encounter: Secondary | ICD-10-CM | POA: Diagnosis not present

## 2018-02-22 DIAGNOSIS — T886XXA Anaphylactic reaction due to adverse effect of correct drug or medicament properly administered, initial encounter: Secondary | ICD-10-CM | POA: Diagnosis not present

## 2018-02-22 LAB — CBC
HCT: 38.6 % — ABNORMAL LOW (ref 40.0–52.0)
Hemoglobin: 13.8 g/dL (ref 13.0–18.0)
MCH: 33.4 pg (ref 26.0–34.0)
MCHC: 35.8 g/dL (ref 32.0–36.0)
MCV: 93.3 fL (ref 80.0–100.0)
Platelets: 179 10*3/uL (ref 150–440)
RBC: 4.13 MIL/uL — AB (ref 4.40–5.90)
RDW: 14 % (ref 11.5–14.5)
WBC: 8.7 10*3/uL (ref 3.8–10.6)

## 2018-02-22 LAB — BASIC METABOLIC PANEL
Anion gap: 8 (ref 5–15)
BUN: 14 mg/dL (ref 8–23)
CO2: 19 mmol/L — ABNORMAL LOW (ref 22–32)
CREATININE: 0.83 mg/dL (ref 0.61–1.24)
Calcium: 9.1 mg/dL (ref 8.9–10.3)
Chloride: 117 mmol/L — ABNORMAL HIGH (ref 98–111)
Glucose, Bld: 124 mg/dL — ABNORMAL HIGH (ref 70–99)
Potassium: 4.8 mmol/L (ref 3.5–5.1)
SODIUM: 144 mmol/L (ref 135–145)

## 2018-02-22 NOTE — Discharge Summary (Signed)
Physician Discharge Summary  Patient ID: Hayden Gray MRN: [id number] DOB/AGE: 1946/08/08 72 y.o.  Admit date: 02/21/2018 Discharge date: 02/22/2018    Discharge Diagnoses:         Anaphylaxis secondary to contrast dye-resolved       Renal Mass                  Hyperlipidemia                                        Hypertension                                                                        DISCHARGE PLAN BY DIAGNOSIS    Anaphylaxis secondary to contrast dye-resolved Plan: Instructed to return to ER or call 911 if symptoms return  Hypertension  Hyperlipidemia  Plan: Continue outpatient atorvastatin, finasteride, and fosinopril  Follow-up with outpatient PCP  Renal Mass Plan: Follow-up with outpatient PCP               DISCHARGE SUMMARY   Hayden Gray is a 72 y.o. y/o male with a PMH of BPH, Hyperlipidemia, and HTN.  He presented to [facility] on 07/11 for an outpatient MRI to assess a renal mass.  He received IV gadolinium contrast for the first time during the MRI and developed severe shortness of breath, urticarial rash, and lip swelling with numbness.  He received 50 mg iv benadryl in MRI without improvement of symptoms, therefore he was transported to the ER.  In the ER he received 0.3 mg of intramuscular epinephrine, pepcid, and solumedrol.  However, his symptoms did not improve he received a second dose of intramuscular epinephrine and epinephrine drip started symptoms mildly improved. He was subsequently admitted to the stepdown unit by hospitalist team for further workup and treatment. Epinephrine gtt stopped on 02/22/18 @0035  due to resolution of anaphylaxis symptoms.   SIGNIFICANT DIAGNOSTIC STUDIES MRI Abd 07/11>>  SIGNIFICANT EVENTS 07/11-Pt admitted to stepdown unit   MICRO DATA  MRSA PCR 07/11>>negative   ANTIBIOTICS None   CONSULTS Intensivist   TUBES / LINES None   Discharge Exam: General: well developed, well nourished male, resting in bed, in  NAD. Neuro: A&O x 3, non-focal.  HEENT: [place]/AT. PERRL, sclerae anicteric. Cardiovascular: RRR, no M/R/G.  Lungs: Respirations even and unlabored.  CTA bilaterally Abdomen: BS x 4, soft, NT/ND.  Musculoskeletal: No gross deformities, no edema.  Skin: Intact, warm, no rashes.   Vitals:   02/22/18 0300 02/22/18 0400 02/22/18 0500 02/22/18 0600  BP: (!) 146/92 (!) 149/102 (!) 149/95 (!) 145/94  Pulse: 80 80 72 81  Resp: 19 (!) 21 19 18   Temp:      TempSrc:      SpO2: 95% 95% 95% 94%  Weight:      Height:         Discharge Labs  BMET Recent Labs  Lab 02/21/18 1718 02/22/18 0609  NA 142 144  K 3.1* 4.8  CL 110 117*  CO2 22 19*  GLUCOSE 133* 124*  BUN 16 14  CREATININE 0.92 0.83  CALCIUM 8.8* 9.1    CBC  Recent Labs  Lab 02/21/18 1718 02/22/18 0609  HGB 14.4 13.8  HCT 41.1 38.6*  WBC 10.0 8.7  PLT 218 179    Anti-Coagulation No results for input(s): INR in the last 168 hours.        Allergies as of 02/22/2018      Reactions   Gadolinium Derivatives Anaphylaxis   After receiving Multihance MRI contrast pt began to sneeze, cough, lips swelling, and hives. Pt evaluated by radiologist Dr. Jearld Lesch who sent pt to ED    Contrast Media [iodinated Diagnostic Agents]       Medication List    TAKE these medications   atorvastatin 20 MG tablet Commonly known as:  LIPITOR Take 20 mg by mouth daily.   finasteride 5 MG tablet Commonly known as:  PROSCAR Take 5 mg by mouth daily.   fosinopril 20 MG tablet Commonly known as:  MONOPRIL Take 20 mg by mouth daily.   tamsulosin 0.4 MG Caps capsule Commonly known as:  FLOMAX Take 0.4 mg by mouth daily after breakfast.        Disposition: Home   Discharged Condition: Hayden Gray has met maximum benefit of inpatient care and is medically stable and cleared for discharge.  Anaphylaxis has resolved.  Patient is pending follow up as above.      Marda Stalker, [place] Pager  [phone] (please enter 7 digits) PCCM Consult Pager [phone] (please enter 7 digits)

## 2018-02-22 NOTE — Progress Notes (Signed)
Discharge instructions provided to patient, who verbalizes understanding.  Peripheral IV's removed.  Daughter will pick patient up and drive him home.

## 2018-02-22 NOTE — Progress Notes (Signed)
Pt stated that he "feels great."  No s/s of hives or allergic reaction.  Spoke with Hinton Dyer, NP.   Epi gtt paused.  Educated patient on calling for any s/s of reaction.  Will check on patient frequently.

## 2018-02-22 NOTE — Progress Notes (Signed)
Pt continues do deny any s/s of an allergic reaction

## 2018-02-25 ENCOUNTER — Other Ambulatory Visit: Payer: Self-pay | Admitting: Internal Medicine

## 2018-02-25 DIAGNOSIS — N281 Cyst of kidney, acquired: Secondary | ICD-10-CM

## 2018-03-05 ENCOUNTER — Ambulatory Visit: Payer: Medicare Other

## 2018-05-09 ENCOUNTER — Ambulatory Visit: Payer: Self-pay

## 2018-09-02 ENCOUNTER — Ambulatory Visit
Admission: RE | Admit: 2018-09-02 | Discharge: 2018-09-02 | Disposition: A | Discharge status: Home / Self Care | Payer: Medicare Other | Source: Ambulatory Visit | Attending: Internal Medicine | Admitting: Internal Medicine

## 2018-09-02 DIAGNOSIS — N281 Cyst of kidney, acquired: Secondary | ICD-10-CM | POA: Diagnosis present

## 2019-04-23 ENCOUNTER — Ambulatory Visit: Payer: Medicare Other

## 2019-04-23 ENCOUNTER — Other Ambulatory Visit: Payer: Self-pay

## 2019-04-23 DIAGNOSIS — Z23 Encounter for immunization: Secondary | ICD-10-CM

## 2019-07-09 IMAGING — DX DG CHEST 1V PORT
1 series · 1 of 1 positions shown · non-contrast
Comparison: Chest CT 01/22/2018

CLINICAL DATA: Shortness of breath.

EXAM:
PORTABLE CHEST 1 VIEW

[chest ap]
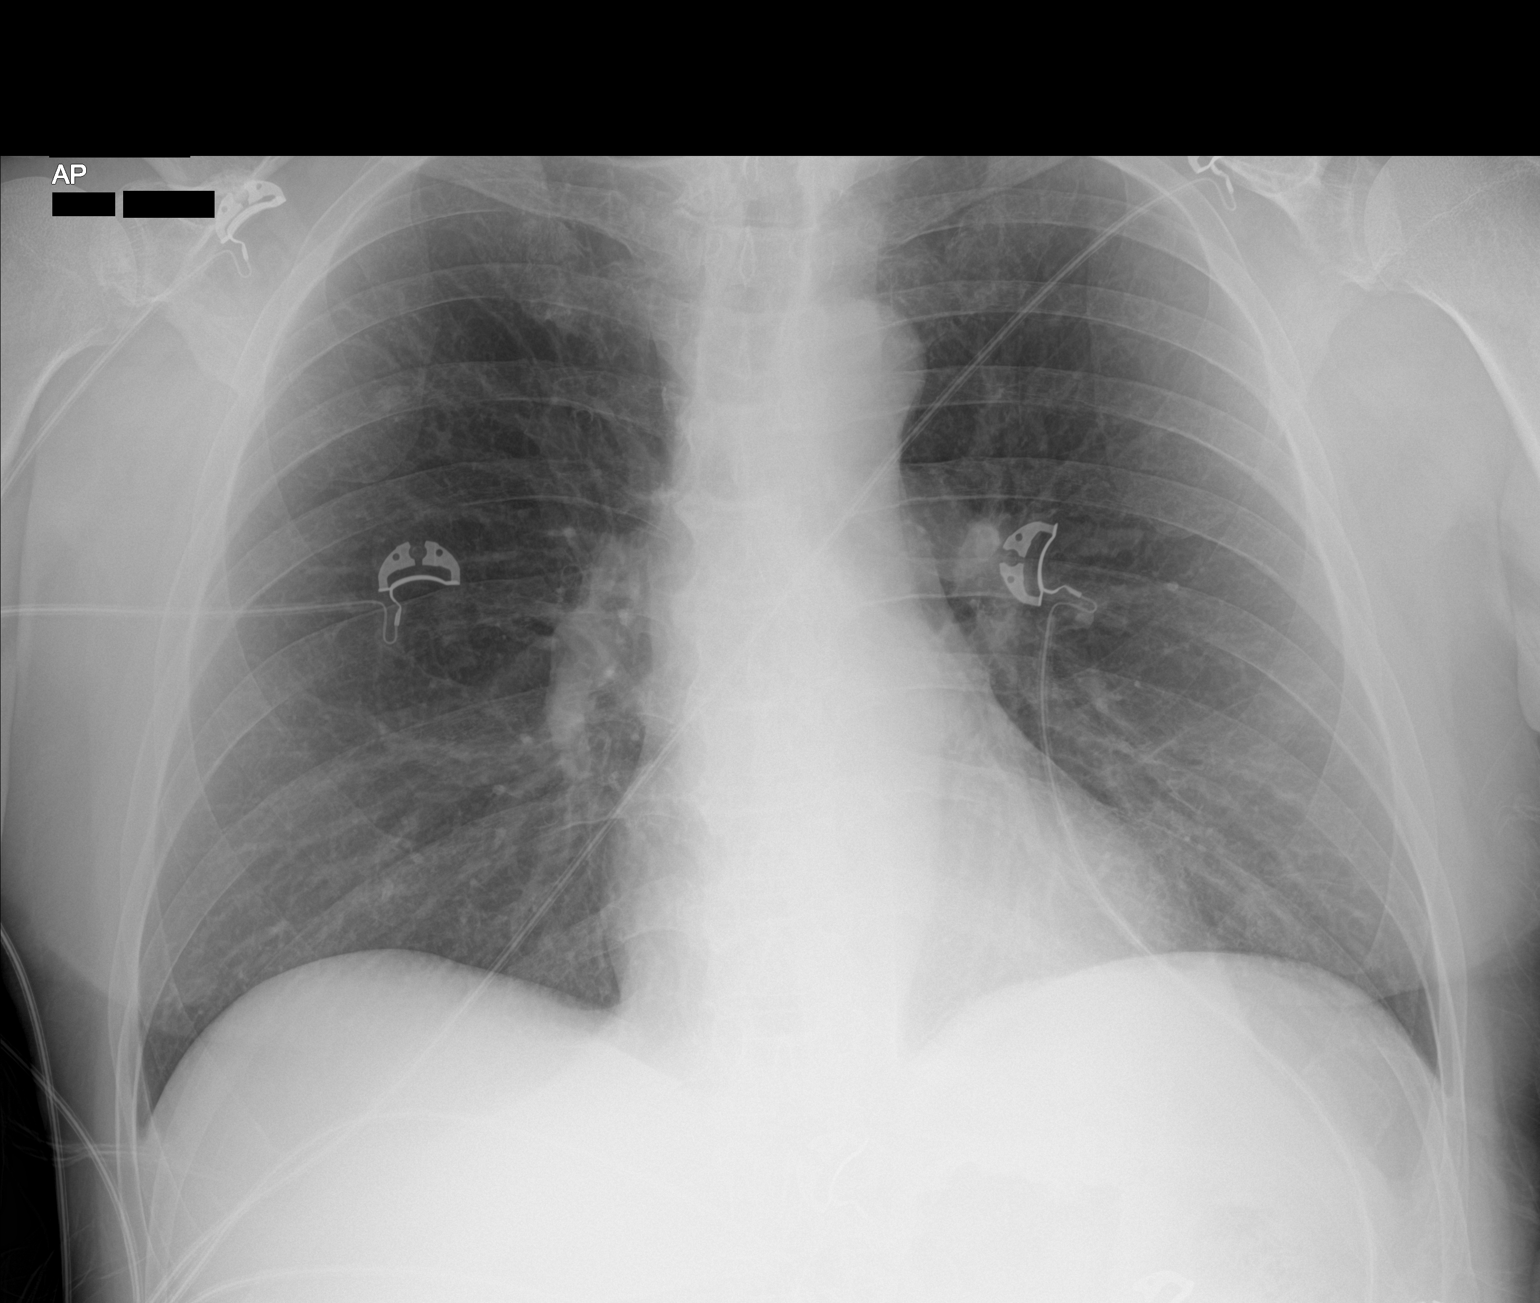

[1 of 1 positions shown; findings below may reference images not displayed]

FINDINGS: 7222 hours. The lungs are clear without focal pneumonia, edema,
pneumothorax or pleural effusion. Nodular density over the right
posterior sixth rib noted on previous CT to be within the rib
itself. The cardiopericardial silhouette is within normal limits for
size. The visualized bony structures of the thorax are intact.
Telemetry leads overlie the chest.
IMPRESSION: 1. No acute cardiopulmonary findings.

## 2019-07-09 IMAGING — MR MR ABDOMEN WO/W CM
11 of 17 series · 30 of 48 positions shown · IV contrast (15mL MULTIHANCE)
Comparison: CT 01/22/2018

CLINICAL DATA: Indeterminate LEFT renal lesion on CT. MRI
recommended for further evaluation.

EXAM:
MRI ABDOMEN WITHOUT AND WITH CONTRAST
TECHNIQUE: Multiplanar multisequence MR imaging of the abdomen was performed
both before and after the administration of intravenous contrast.
Patient had a significant allergic reaction to IV gadolinium. See
consult note in [REDACTED]
CONTRAST:  15mL MULTIHANCE GADOBENATE DIMEGLUMINE 529 MG/ML IV SOLN

[Series 3: T2 · coronal · 8.0mm · 0.78mm/px · 2 of 24 slices shown]
[im 1/24]
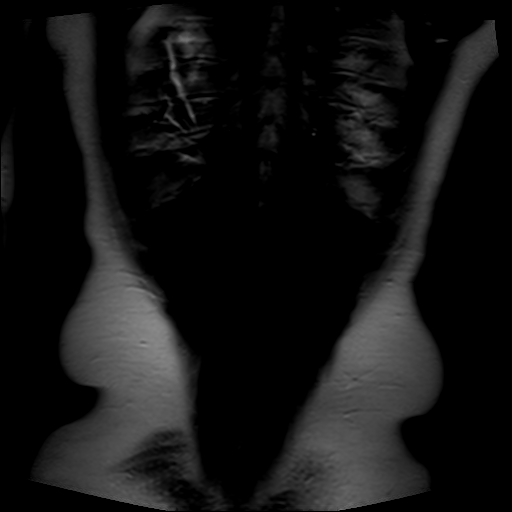
[im 24/24]
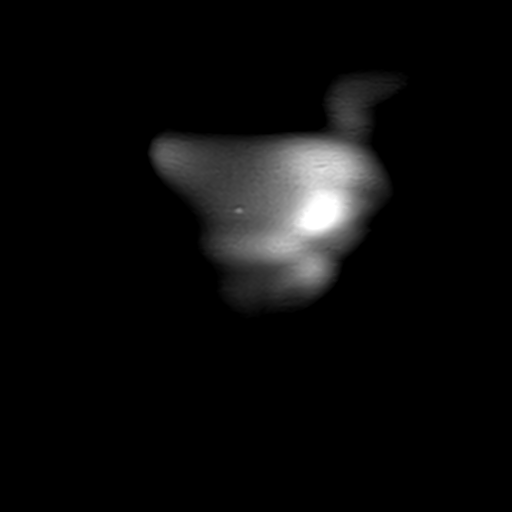

[Series 4: T2 fat-sat · axial · 7.0mm · 0.74mm/px · z∈[-111,+40]mm · 2 of 19 slices shown]
[im 1/19]
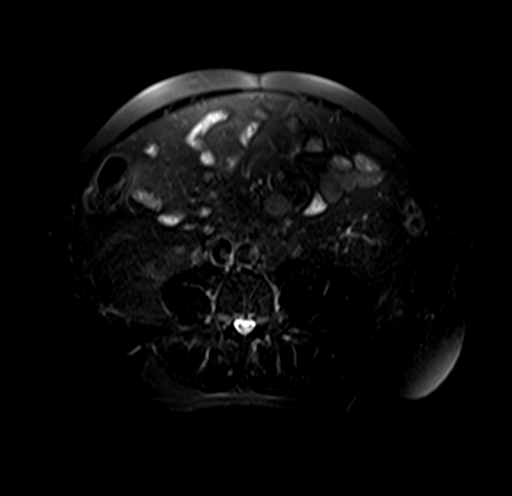
[im 19/19]
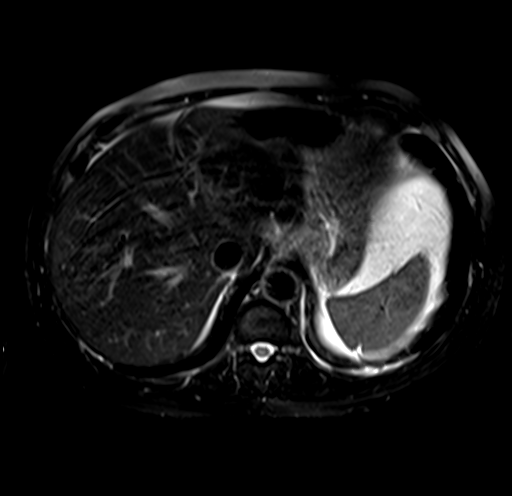

[Series 5: T1 · axial · 7.0mm · 0.74mm/px · z∈[-111,+40]mm · 2 of 38 slices shown]
[im 1/38]
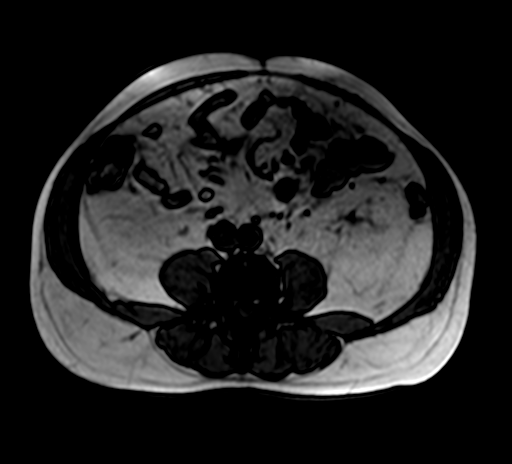
[im 38/38]
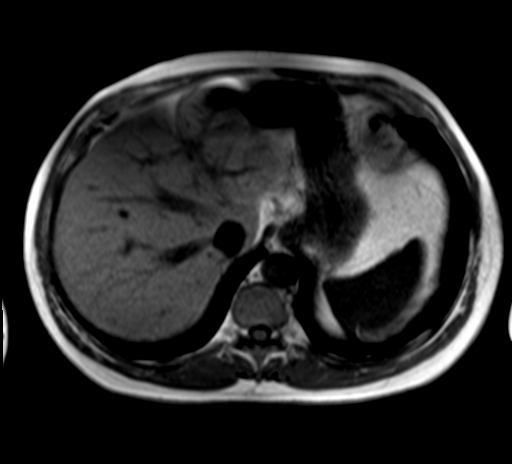

[Series 7: DWI · axial · 6.0mm · 1.98mm/px · z∈[-125,+148]mm · 6 of 115 slices shown]
[im 1/115]
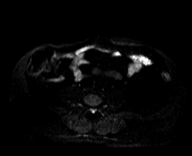
[im 23/115]
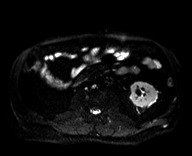
[im 46/115]
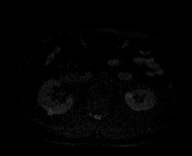
[im 69/115]
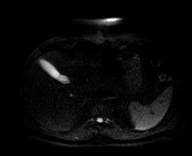
[im 92/115]
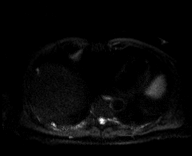
[im 115/115]
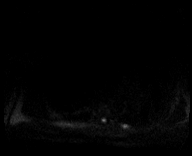

[Series 8: ax dwi_adc · axial · 6.0mm · 1.98mm/px · z∈[-125,+148]mm · 2 of 39 slices shown]
[im 1/39]
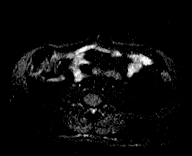
[im 39/39]
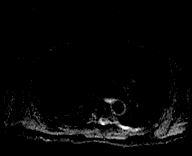

[Series 9: bSSFP · axial · 4.0mm · 0.74mm/px · z∈[-113,+43]mm · 2 of 40 slices shown]
[im 1/40]
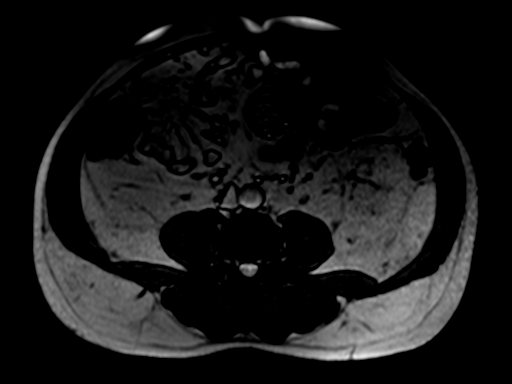
[im 40/40]
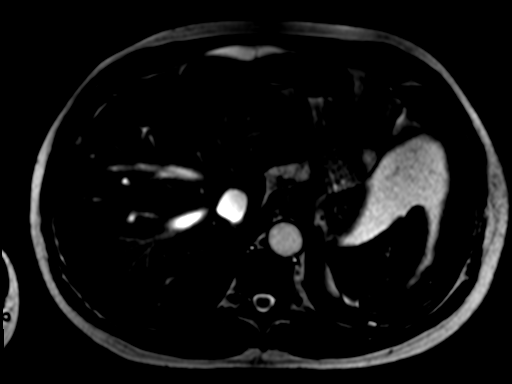

[Series 10: T1 dynamic fat-sat · axial · non-contrast · 2.5mm · 0.74mm/px · z∈[-111,+46]mm · 3 of 64 slices shown (1 of 3)]
[im 1/64]
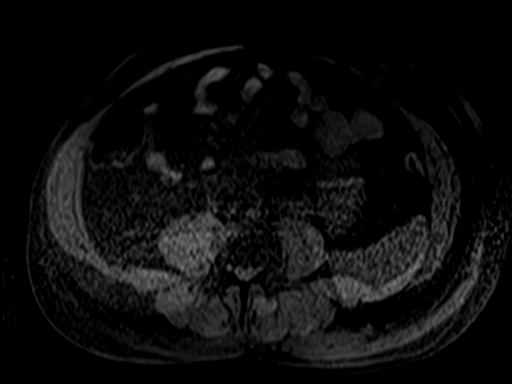
[im 32/64]
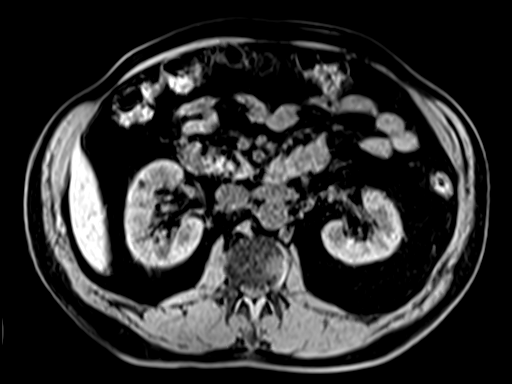
[im 64/64]
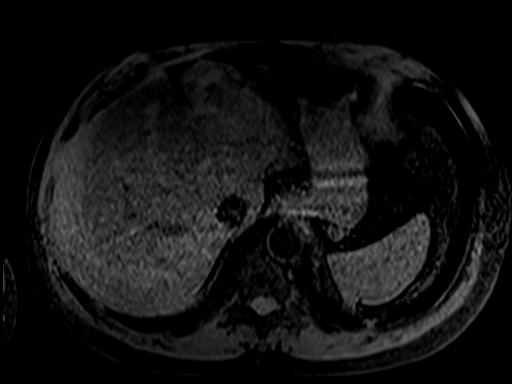

[Series 11: T1 dynamic fat-sat post-contrast · axial · 2.5mm · 0.74mm/px · z∈[-111,+46]mm · 3 of 64 slices shown (1 of 2)]
[im 1/64]
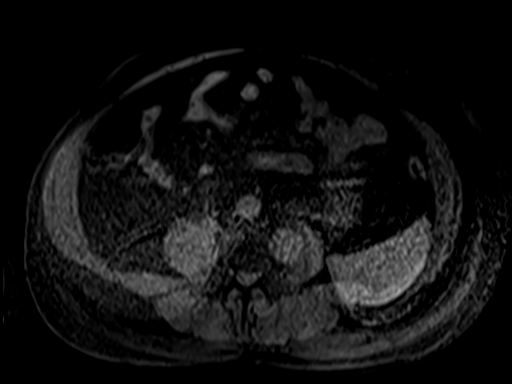
[im 32/64]
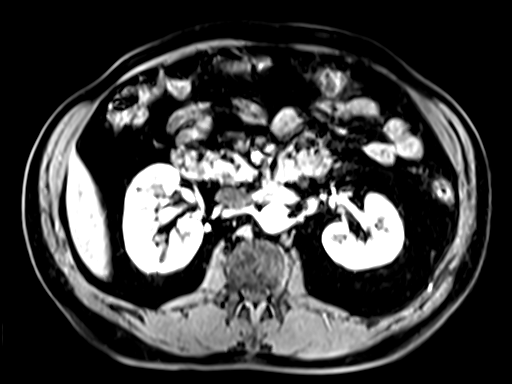
[im 64/64]
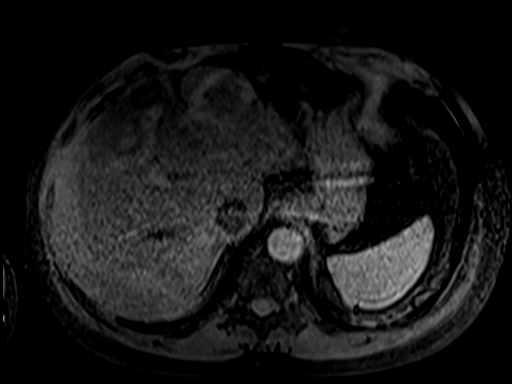

[Series 12: T1 dynamic fat-sat · axial · 2.5mm · 0.74mm/px · z∈[-111,+46]mm · 3 of 64 slices shown (2 of 3)]
[im 1/64]
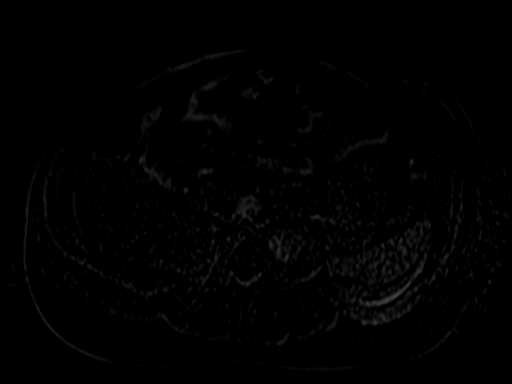
[im 32/64]
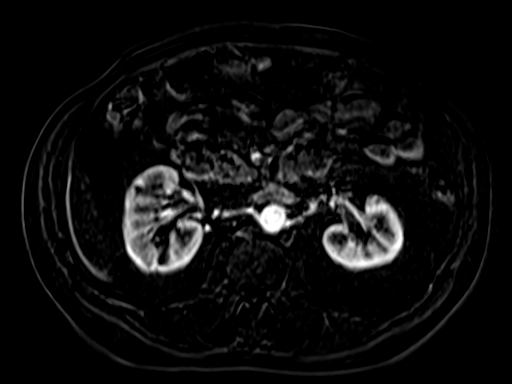
[im 64/64]
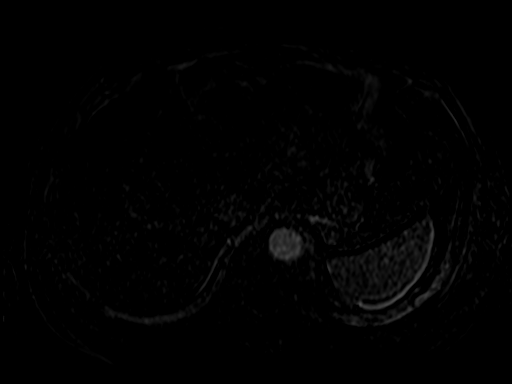

[Series 13: T1 dynamic fat-sat post-contrast · axial · 2.5mm · 0.74mm/px · z∈[-111,+46]mm · 3 of 64 slices shown (2 of 2)]
[im 1/64]
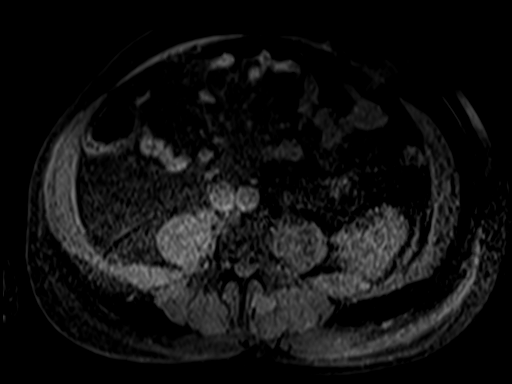
[im 32/64]
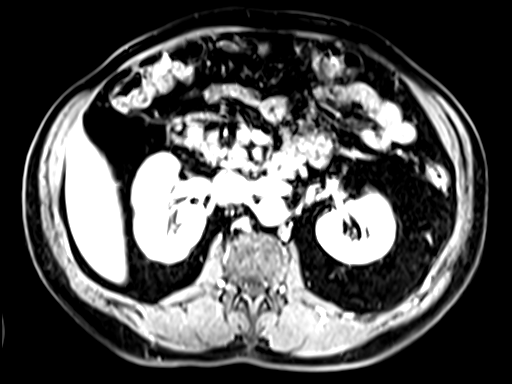
[im 64/64]
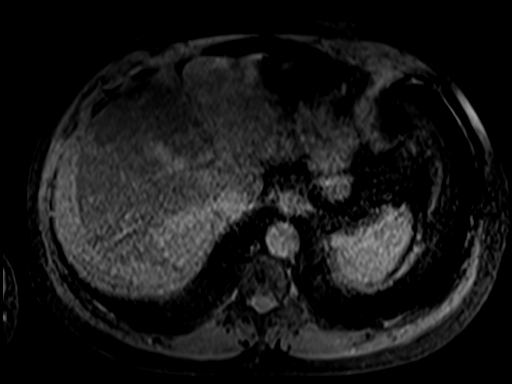

[Series 14: T1 dynamic fat-sat · axial · 2.5mm · 0.74mm/px · z∈[-111,-34]mm · 2 of 64 slices shown (3 of 3)]
[im 1/64]
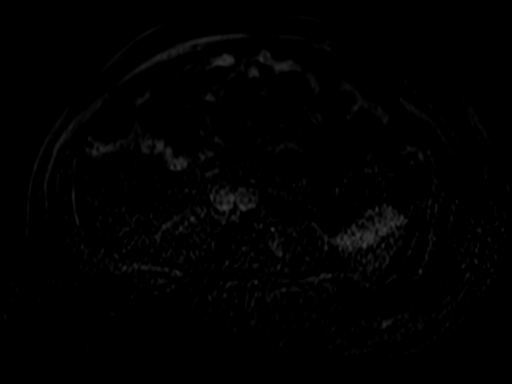
[im 32/64]
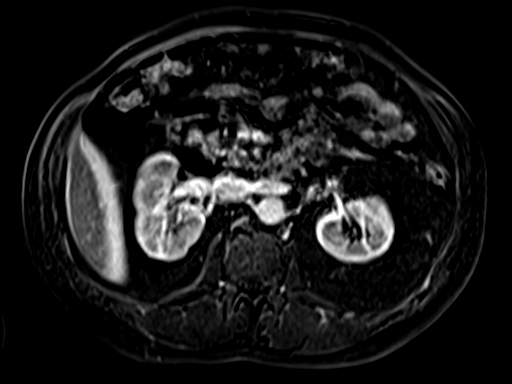

[30 of 48 positions shown; findings below may reference images not displayed]

FINDINGS: Lower chest:  Lung bases are clear.

Hepatobiliary: No focal hepatic lesion. Multiple small benign
hepatic cysts present. Gallbladder normal. Common bile duct normal.

Pancreas: Normal pancreatic parenchymal intensity. No ductal
dilatation or inflammation.

Spleen: Normal spleen.

Adrenals/urinary tract: Adrenal glands and kidneys are normal.

Stomach/Bowel: The upper pole of the LEFT kidney there is a rounded
lesion which is difficult to define on the noncontrast series but is
mixed hyperintense on T2 weighted imaging (image [DATE]). Lesion is
measurable on the ADC map pat 18 mm (image [DATE])

Lesion demonstrates potential mild enhancement by direct measurement
(series 15 and 19).

There several small nonenhancing cystic lesions in the RIGHT kidney.
By subtraction imaging there is no clear enhancement

Vascular/Lymphatic: Abdominal aortic normal caliber. No
retroperitoneal periportal lymphadenopathy.

Musculoskeletal: No aggressive osseous lesion
IMPRESSION: 1. Anaphylactic reaction to IV gadolinium.
2. Potentially enhancement of partially cystic lesion in the upper
pole of the LEFT kidney is categorized as a Bosniak 2F lesion. These
indeterminate lesions require follow-up. Consider MRI without
contrast or ultrasound follow-up in 6 months.
3. Bosniak 1 renal cysts of the RIGHT kidney.  Benign hepatic cysts.

## 2019-08-21 ENCOUNTER — Ambulatory Visit
Admission: RE | Admit: 2019-08-21 | Discharge: 2019-08-21 | Disposition: A | Discharge status: Home / Self Care | Payer: Medicare Other | Source: Ambulatory Visit | Attending: Internal Medicine | Admitting: Internal Medicine

## 2019-08-21 ENCOUNTER — Other Ambulatory Visit: Payer: Self-pay

## 2019-08-21 ENCOUNTER — Encounter (INDEPENDENT_AMBULATORY_CARE_PROVIDER_SITE_OTHER): Payer: Self-pay

## 2019-08-21 ENCOUNTER — Other Ambulatory Visit: Payer: Self-pay | Admitting: Internal Medicine

## 2019-08-21 DIAGNOSIS — R7989 Other specified abnormal findings of blood chemistry: Secondary | ICD-10-CM | POA: Diagnosis present

## 2019-08-22 ENCOUNTER — Other Ambulatory Visit: Payer: Self-pay

## 2019-08-22 ENCOUNTER — Other Ambulatory Visit: Payer: Self-pay | Admitting: Internal Medicine

## 2019-08-22 ENCOUNTER — Ambulatory Visit
Admission: RE | Admit: 2019-08-22 | Discharge: 2019-08-22 | Disposition: A | Discharge status: Home / Self Care | Payer: Medicare Other | Source: Ambulatory Visit | Attending: Internal Medicine | Admitting: Internal Medicine

## 2019-08-22 DIAGNOSIS — K831 Obstruction of bile duct: Secondary | ICD-10-CM | POA: Diagnosis present

## 2019-08-22 MED ORDER — IOHEXOL 300 MG/ML  SOLN
100.0000 mL | Freq: Once | INTRAMUSCULAR | Status: AC | PRN
  Administered 2019-08-22: 100 mL via INTRAVENOUS

## 2022-07-13 ENCOUNTER — Encounter (INDEPENDENT_AMBULATORY_CARE_PROVIDER_SITE_OTHER): Payer: Self-pay
